# Patient Record
Sex: Male | Born: 1995 | Race: Black or African American | Hispanic: No | Marital: Single | Smoking: Current every day smoker
Health system: Southern US, Community
[De-identification: ages and names within clinical notes are randomized; demographics above are authoritative.]

---
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
## 2017-10-19 ENCOUNTER — Encounter (HOSPITAL_COMMUNITY): Payer: Self-pay

## 2017-10-19 ENCOUNTER — Emergency Department (HOSPITAL_COMMUNITY)
Admission: EM | Admit: 2017-10-19 | Discharge: 2017-10-19 | Disposition: A | Discharge status: Home / Self Care | Payer: BLUE CROSS/BLUE SHIELD | Attending: Emergency Medicine | Admitting: Emergency Medicine

## 2017-10-19 ENCOUNTER — Other Ambulatory Visit: Payer: Self-pay

## 2017-10-19 DIAGNOSIS — J40 Bronchitis, not specified as acute or chronic: Secondary | ICD-10-CM | POA: Diagnosis not present

## 2017-10-19 DIAGNOSIS — F17210 Nicotine dependence, cigarettes, uncomplicated: Secondary | ICD-10-CM | POA: Diagnosis not present

## 2017-10-19 DIAGNOSIS — R0981 Nasal congestion: Secondary | ICD-10-CM | POA: Insufficient documentation

## 2017-10-19 DIAGNOSIS — R05 Cough: Secondary | ICD-10-CM | POA: Diagnosis not present

## 2017-10-19 DIAGNOSIS — J029 Acute pharyngitis, unspecified: Secondary | ICD-10-CM

## 2017-10-19 DIAGNOSIS — R509 Fever, unspecified: Secondary | ICD-10-CM | POA: Diagnosis not present

## 2017-10-19 LAB — RAPID STREP SCREEN (MED CTR MEBANE ONLY): STREPTOCOCCUS, GROUP A SCREEN (DIRECT): NEGATIVE

## 2017-10-19 MED ORDER — BENZONATATE 100 MG PO CAPS: 100.0000 mg | ORAL_CAPSULE | Freq: Three times a day (TID) | ORAL | 0 refills | Status: DC

## 2017-10-19 MED ORDER — AZITHROMYCIN 250 MG PO TABS: ORAL_TABLET | ORAL | 0 refills | Status: DC

## 2017-10-19 NOTE — ED Triage Notes (Signed)
Patient presents with sore throat and chills starting Monday.

## 2017-10-19 NOTE — ED Provider Notes (Signed)
[facility]-EMERGENCY DEPT Provider Note   CSN: [id number] Arrival date & time: 10/19/17  1832     History   Chief Complaint Chief Complaint  Patient presents with  . Sore Throat    HPI Riley Carpenter is a 22 y.o. male who presents to the ED with sore throat chills and fever that started a few days ago. Patient is an every day smoker and reports productive cough with green sputum. Patient has been around people that have been sick.   HPI  History reviewed. No pertinent past medical history.  There are no active problems to display for this patient.   History reviewed. No pertinent surgical history.      Home Medications    Prior to Admission medications   Medication Sig Start Date End Date Taking? Authorizing Provider  azithromycin (ZITHROMAX Z-PAK) 250 MG tablet Take the first 2 tablets now and then one tablet PO daily 10/19/17   Janne NapoleonNeese, Alnita Aybar M, NP  benzonatate (TESSALON) 100 MG capsule Take 1 capsule (100 mg total) by mouth every 8 (eight) hours. 10/19/17   Janne NapoleonNeese, Kaseem Vastine M, NP  ibuprofen (ADVIL,MOTRIN) 600 MG tablet Take 1 tablet (600 mg total) by mouth every 6 (six) hours as needed. 12/19/14   Elson AreasSofia, Leslie K, PA-C    Family History History reviewed. No pertinent family history.  Social History Social History   Tobacco Use  . Smoking status: Never Smoker  Substance Use Topics  . Alcohol use: No  . Drug use: Not on file     Allergies   Patient has no known allergies.   Review of Systems Review of Systems  Constitutional: Positive for chills and fever.  HENT: Positive for congestion and sore throat.   Eyes: Negative for pain and discharge.  Respiratory: Positive for cough. Negative for shortness of breath.   Cardiovascular: Negative for chest pain.  Gastrointestinal: Negative for abdominal pain, diarrhea, nausea and vomiting.  Genitourinary: Negative for frequency.  Musculoskeletal: Positive for myalgias.  Skin: Negative for rash.    Neurological: Negative for headaches.  Psychiatric/Behavioral: Negative for confusion.     Physical Exam Updated Vital Signs BP (!) 145/75 (BP Location: Left Arm)   Pulse 92   Temp 99 F (37.2 C) (Oral)   Resp 18   Ht 6\' 3"  (1.905 m)   Wt 117.9 kg (260 lb)   SpO2 97%   BMI 32.50 kg/m   Physical Exam  Constitutional: He appears well-developed and well-nourished. He does not appear ill. He appears distressed.  HENT:  Head: Normocephalic and atraumatic.  Right Ear: Tympanic membrane normal.  Left Ear: Tympanic membrane normal.  Nose: Mucosal edema and rhinorrhea present.  Mouth/Throat: Uvula is midline and mucous membranes are normal. Posterior oropharyngeal erythema present. No posterior oropharyngeal edema or tonsillar abscesses.  Eyes: Pupils are equal, round, and reactive to light. Conjunctivae and EOM are normal.  Neck: Neck supple.  Cardiovascular: Normal rate and regular rhythm.  Pulmonary/Chest: Effort normal. He has no wheezes. He has no rales.  Abdominal: Soft. There is no tenderness.  Musculoskeletal: Normal range of motion.  Neurological: He is alert.  Skin: Skin is warm and dry.  Psychiatric: He has a normal mood and affect. His behavior is normal.  Nursing note and vitals reviewed.    ED Treatments / Results  Labs (all labs ordered are listed, but only abnormal results are displayed) Labs Reviewed  RAPID STREP SCREEN (NOT AT [facility])  CULTURE, GROUP A STREP [facility])  EKG None  Radiology No results found.  Procedures Procedures (including critical care time)  Medications Ordered in ED Medications - No data to display   Initial Impression / Assessment and Plan / ED Course  I have reviewed the triage vital signs and the nursing notes. 22 y.o. male that is an every day smoker stable for d/c with negative strep screen and does not appear toxic. Will treat for bronchitis. Patient to f/u with PCP or return for worsening symptoms.   Final Clinical  Impressions(s) / ED Diagnoses   Final diagnoses:  Bronchitis  Sore throat    ED Discharge Orders        Ordered    azithromycin (ZITHROMAX Z-PAK) 250 MG tablet     10/19/17 2029    benzonatate (TESSALON) 100 MG capsule  Every 8 hours     10/19/17 2029       Kerrie Buffalo [place], NP 10/19/17 2030    Tegeler, Canary Brim, MD 10/19/17 [phone]

## 2017-10-19 NOTE — Discharge Instructions (Addendum)
Take tylenol and ibuprofen as needed for fever or body aches. Follow up with your doctor. Return here for worsening symptoms.

## 2017-10-23 LAB — CULTURE, GROUP A STREP (THRC)

## 2017-11-12 ENCOUNTER — Ambulatory Visit (INDEPENDENT_AMBULATORY_CARE_PROVIDER_SITE_OTHER): Payer: Self-pay | Admitting: Physician Assistant

## 2017-11-12 DIAGNOSIS — Z0283 Encounter for blood-alcohol and blood-drug test: Secondary | ICD-10-CM

## 2017-11-14 ENCOUNTER — Telehealth: Payer: Self-pay | Admitting: Physician Assistant

## 2017-11-14 NOTE — Patient Instructions (Signed)
Pt here for self pay drug screen only.

## 2017-11-14 NOTE — Telephone Encounter (Signed)
Copied from CRM [id number]. Topic: General - Other >> Nov 14, 2017  1:37 PM Gerrianne ScalePayne, Ferguson Gertner L wrote: Reason for CRM: patient calling to see if his results are in from a drug screen that he took an Monday

## 2017-11-14 NOTE — Progress Notes (Signed)
Pt here for self pay drug screen

## 2017-11-15 ENCOUNTER — Telehealth: Payer: Self-pay | Admitting: Physician Assistant

## 2017-11-15 NOTE — Telephone Encounter (Signed)
Patient is calling back to check on the status of this. Please advise  

## 2017-11-15 NOTE — Telephone Encounter (Signed)
Pt spoke to JennetteLizzie this morning about DS results, but he came by because he says he has been told several different things about how long it will take to come back.  Is there a way to also speed up the process?  Please call him again [phone]

## 2017-11-15 NOTE — Telephone Encounter (Signed)
Phone call to patient. Advised that it may take up to two weeks for results, please call if it has been more than two weeks and he has not been contacted with results. He is agreeable.

## 2017-11-19 NOTE — Progress Notes (Signed)
I did not evaluate this pt. FT only.

## 2017-12-02 ENCOUNTER — Encounter (HOSPITAL_COMMUNITY): Payer: Self-pay

## 2017-12-02 ENCOUNTER — Emergency Department (HOSPITAL_COMMUNITY)
Admission: EM | Admit: 2017-12-02 | Discharge: 2017-12-02 | Disposition: A | Discharge status: Home / Self Care | Payer: BLUE CROSS/BLUE SHIELD | Attending: Emergency Medicine | Admitting: Emergency Medicine

## 2017-12-02 ENCOUNTER — Other Ambulatory Visit: Payer: Self-pay

## 2017-12-02 DIAGNOSIS — F17200 Nicotine dependence, unspecified, uncomplicated: Secondary | ICD-10-CM | POA: Insufficient documentation

## 2017-12-02 DIAGNOSIS — J0100 Acute maxillary sinusitis, unspecified: Secondary | ICD-10-CM

## 2017-12-02 DIAGNOSIS — J40 Bronchitis, not specified as acute or chronic: Secondary | ICD-10-CM

## 2017-12-02 DIAGNOSIS — J029 Acute pharyngitis, unspecified: Secondary | ICD-10-CM | POA: Diagnosis present

## 2017-12-02 LAB — GROUP A STREP BY PCR: Group A Strep by PCR: NOT DETECTED

## 2017-12-02 MED ORDER — PREDNISONE 10 MG (21) PO TBPK: ORAL_TABLET | ORAL | 0 refills | Status: DC

## 2017-12-02 MED ORDER — BENZONATATE 200 MG PO CAPS: 200.0000 mg | ORAL_CAPSULE | Freq: Three times a day (TID) | ORAL | 0 refills | Status: DC | PRN

## 2017-12-02 MED ORDER — IPRATROPIUM-ALBUTEROL 0.5-2.5 (3) MG/3ML IN SOLN
3.0000 mL | Freq: Once | RESPIRATORY_TRACT | Status: AC
  Administered 2017-12-02: 3 mL via RESPIRATORY_TRACT
  Filled 2017-12-02: qty 3

## 2017-12-02 MED ORDER — AMOXICILLIN-POT CLAVULANATE 875-125 MG PO TABS: 1.0000 | ORAL_TABLET | Freq: Two times a day (BID) | ORAL | 0 refills | Status: DC

## 2017-12-02 NOTE — ED Triage Notes (Signed)
Pt c/o nasal congestion,sore throat, cough, yellow sputum x3 days. Tried allergy medicine, little relief.

## 2017-12-02 NOTE — ED Provider Notes (Signed)
Riley Carpenter Provider Note   CSN: [id number] Arrival date & time: 12/02/17  1307     History   Chief Complaint Chief Complaint  Patient presents with  . Sore Throat  . Nasal Congestion    HPI Riley Carpenter is a 22 y.o. male, every day smoker, who presents to the ED with sore throat, ear pain and fever that started 3 days ago. Patient reports he has post nasal drainage and pain around his eyes and forehead. His nasal drainage is yellow/green. He does have a cough but thinks partly due to the post nasal drainage.   The history is provided by the patient. No language interpreter was used.  URI   This is a new problem. The current episode started more than 2 days ago. The maximum temperature recorded prior to his arrival was 101 to 101.9 F. Associated symptoms include congestion, plugged ear sensation, sinus pain, sore throat, swollen glands, cough and wheezing. Pertinent negatives include no abdominal pain, no diarrhea, no nausea, no vomiting, no dysuria, no ear pain, no headaches and no rash.    History reviewed. No pertinent past medical history.  There are no active problems to display for this patient.   History reviewed. No pertinent surgical history.      Home Medications    Prior to Admission medications   Medication Sig Start Date End Date Taking? Authorizing Provider  amoxicillin-clavulanate (AUGMENTIN) 875-125 MG tablet Take 1 tablet by mouth every 12 (twelve) hours. 12/02/17   Riley Napoleon, NP  benzonatate (TESSALON) 200 MG capsule Take 1 capsule (200 mg total) by mouth 3 (three) times daily as needed for cough. 12/02/17   Riley Napoleon, NP  predniSONE (STERAPRED UNI-PAK 21 TAB) 10 MG (21) TBPK tablet Take 6 tablets today PO then 5, 4, 3, 2, 1 12/02/17   Riley Napoleon, NP    Family History History reviewed. No pertinent family history.  Social History Social History   Tobacco Use  . Smoking status: Current Every Day Smoker   Packs/day: 1.00  Substance Use Topics  . Alcohol use: Yes    Comment: occasionally  . Drug use: Not on file     Allergies   Patient has no known allergies.   Review of Systems Review of Systems  Constitutional: Positive for chills and fever.  HENT: Positive for congestion, sinus pain and sore throat. Negative for ear pain.   Eyes: Negative for pain, discharge, redness and itching.  Respiratory: Positive for cough and wheezing.   Gastrointestinal: Negative for abdominal pain, diarrhea, nausea and vomiting.  Genitourinary: Negative for dysuria, frequency and urgency.  Musculoskeletal: Positive for myalgias.  Skin: Negative for rash.  Neurological: Negative for syncope and headaches.  Psychiatric/Behavioral: Negative for confusion.     Physical Exam Updated Vital Signs BP 104/69 (BP Location: Right Arm)   Pulse 99   Temp 99.8 F (37.7 C) (Oral)   Resp 16   Ht  (1.905 m)   Wt 117.9 kg (260 lb)   SpO2 98%   BMI 32.50 kg/m   Physical Exam  Constitutional: He appears well-developed and well-nourished. No distress.  HENT:  Head: Normocephalic.  Right Ear: Tympanic membrane normal.  Left Ear: Tympanic membrane is erythematous.  Nose: Mucosal edema and rhinorrhea present. Right sinus exhibits maxillary sinus tenderness. Left sinus exhibits maxillary sinus tenderness.  Mouth/Throat: Uvula is midline and mucous membranes are normal. Posterior oropharyngeal erythema present.  Eyes: Pupils are equal, round, and  reactive to light. Conjunctivae and EOM are normal.  Neck: Normal range of motion. Neck supple.  Cardiovascular: Normal rate and regular rhythm.  Pulmonary/Chest: Effort normal. He has wheezes (occasional).  Abdominal: Soft. There is no tenderness.  Musculoskeletal: Normal range of motion.  Lymphadenopathy:    He has cervical adenopathy.  Neurological: He is alert.  Skin: Skin is warm and dry.  Psychiatric: He has a normal mood and affect. His behavior is  normal.  Nursing note and vitals reviewed.    ED Treatments / Results  Labs (all labs ordered are listed, but only abnormal results are displayed) Labs Reviewed  GROUP A STREP BY PCR   Radiology No results found.  Procedures Procedures (including critical care time)  Medications Ordered in ED Medications  ipratropium-albuterol (DUONEB) 0.5-2.5 (3) MG/3ML nebulizer solution 3 mL (3 mLs Nebulization Given 12/02/17 1420)     Initial Impression / Assessment and Plan / ED Course  I have reviewed the triage vital signs and the nursing notes. 22 y.o. male every day smoker here with fever, sore throat, left ear pain, sinus pain and drainage stable for d/c without meningeal signs and does not appear toxic. Will treat for sinusitis and encouraged patient to f/u with a PCP. Return precautions discussed.   Final Clinical Impressions(s) / ED Diagnoses   Final diagnoses:  Acute maxillary sinusitis, recurrence not specified  Bronchitis    ED Discharge Orders        Ordered    amoxicillin-clavulanate (AUGMENTIN) 875-125 MG tablet  Every 12 hours     12/02/17 1740    benzonatate (TESSALON) 200 MG capsule  3 times daily PRN     12/02/17 1740    predniSONE (STERAPRED UNI-PAK 21 TAB) 10 MG (21) TBPK tablet     12/02/17 1740       Damian Leavell [place], NP 12/02/17 1942    Shaune Pollack, MD 12/03/17 [phone]

## 2017-12-02 NOTE — ED Notes (Signed)
Called lab to inquire about delay on strep test--they are running strep test now.

## 2018-08-14 ENCOUNTER — Encounter: Payer: Self-pay | Admitting: Family Medicine

## 2018-08-14 ENCOUNTER — Ambulatory Visit (INDEPENDENT_AMBULATORY_CARE_PROVIDER_SITE_OTHER): Payer: Self-pay | Admitting: Family Medicine

## 2018-08-14 VITALS — BP 142/65 | HR 65 | Temp 98.3°F | Resp 16 | Ht 75.0 in | Wt 285.0 lb

## 2018-08-14 DIAGNOSIS — R1011 Right upper quadrant pain: Secondary | ICD-10-CM

## 2018-08-14 DIAGNOSIS — Z833 Family history of diabetes mellitus: Secondary | ICD-10-CM

## 2018-08-14 DIAGNOSIS — E669 Obesity, unspecified: Secondary | ICD-10-CM

## 2018-08-14 DIAGNOSIS — Z114 Encounter for screening for human immunodeficiency virus [HIV]: Secondary | ICD-10-CM

## 2018-08-14 NOTE — Progress Notes (Signed)
[facility]  New Patient Encounter Provider: Mike Gip, [place]    PIR:[id number]  YTK:[id number]  DOB - July 14, 1996  SUBJECTIVE:   Riley Carpenter, is a 23 y.o. male who presents to establish care with this clinic.   Current problems/concerns:  Patient states that he has RUQ pain with a "knot" that he noticed yesterday. He states that he has a significant family history for diabetes and hypertension.  Patient states that he does not drink a lot of water. Main source of fluid is soda. Patient states that he cooks more at home and is not eating as much fast food. Has been unemployed for the past 6 months and has decreased physical activity. Patient states that he lost weight and went  from 350 to 250. Has slowly started to increase weight in since being unemployed.  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    No Known Allergies History reviewed. No pertinent past medical history. No current outpatient medications on file prior to visit.   No current facility-administered medications on file prior to visit.    Family History  Problem Relation Age of Onset  . Diabetes Mother   . Diabetes Father    Social History   Socioeconomic History  . Marital status: Single    Spouse name: Not on file  . Number of children: Not on file  . Years of education: Not on file  . Highest education level: Not on file  Occupational History  . Not on file  Social Needs  . Financial resource strain: Not on file  . Food insecurity:    Worry: Not on file    Inability: Not on file  . Transportation needs:    Medical: Not on file    Non-medical: Not on file  Tobacco Use  . Smoking status: Current Every Day Smoker    Packs/day: 1.00  . Smokeless tobacco: Current User    Types: Chew  Substance and Sexual Activity  . Alcohol use: Yes    Comment: occasionally  . Drug use: Not Currently  . Sexual activity: Not on  file  Lifestyle  . Physical activity:    Days per week: Not on file    Minutes per session: Not on file  . Stress: Not on file  Relationships  . Social connections:    Talks on phone: Not on file    Gets together: Not on file    Attends religious service: Not on file    Active member of club or organization: Not on file    Attends meetings of clubs or organizations: Not on file    Relationship status: Not on file  . Intimate partner violence:    Fear of current or ex partner: Not on file    Emotionally abused: Not on file    Physically abused: Not on file    Forced sexual activity: Not on file  Other Topics Concern  . Not on file  Social History Narrative  . Not on file    Review of Systems  Constitutional: Negative.   HENT: Negative.   Eyes: Negative.   Respiratory: Negative.   Cardiovascular: Negative.   Gastrointestinal: Positive for abdominal pain.  Genitourinary: Negative.   Musculoskeletal: Negative.   Skin: Negative.   Neurological: Negative.   Psychiatric/Behavioral: Negative.      OBJECTIVE:    BP (!) 142/65 (BP Location: Right Arm, Patient Position: Sitting, Cuff Size: Large)  Pulse 65   Temp 98.3 F (36.8 C) (Oral)   Resp 16   Ht 6\' 3"  (1.905 m)   Wt 285 lb (129.3 kg)   SpO2 99%   BMI 35.62 kg/m   Physical Exam  Constitutional: He is oriented to person, place, and time and well-developed, well-nourished, and in no distress. No distress.  HENT:  Head: Normocephalic and atraumatic.  Eyes: Pupils are equal, round, and reactive to light. Conjunctivae and EOM are normal.  Neck: Normal range of motion. Neck supple.  Cardiovascular: Normal rate, regular rhythm and intact distal pulses. Exam reveals no gallop and no friction rub.  No murmur heard. Pulmonary/Chest: Effort normal and breath sounds normal. No respiratory distress. He has no wheezes.  Abdominal: Soft. Bowel sounds are normal. There is no abdominal tenderness.  Musculoskeletal: Normal  range of motion.        General: No tenderness or edema.  Lymphadenopathy:    He has no cervical adenopathy.  Neurological: He is alert and oriented to person, place, and time. Gait normal.  Skin: Skin is warm and dry.  Psychiatric: Mood, memory, affect and judgment normal.  Nursing note and vitals reviewed.    ASSESSMENT/PLAN:  1. RUQ pain No pain in the exam today. Labs ordered. Will continue to monitor.  - CBC with Differential - Amylase - Lipase  2. Family history of diabetes mellitus (DM) The patient is asked to make an attempt to improve diet and exercise patterns to aid in medical management of this problem. Pending labs. Will adjust medications accordingly.    - Hemoglobin A1c - CBC with Differential  3. Obesity (BMI 35.0-39.9 without comorbidity) Patient encouraged to be more active and to decrease soda intake.  - Hemoglobin A1c - Comprehensive metabolic panel - TSH  4. Screening for HIV (human immunodeficiency virus) - HIV antibody (with reflex)  Return in about 1 year (around 08/15/2019), or if symptoms worsen or fail to improve.  The patient was given clear instructions to go to ER or return to medical center if symptoms don't improve, worsen or new problems develop. The patient verbalized understanding. The patient was told to call to get lab results if they haven't heard anything in the next week.     This note has been created with Education officer, environmental. Any transcriptional errors are unintentional.   Ms. Andr L. Riley Lam, FNP-BC [facility] [address] [place], [place] [address] [phone]

## 2018-08-14 NOTE — Patient Instructions (Signed)
Healthy Eating to Prevent Digestive Disorders The digestive system starts at the mouth and goes all the way down to the rectum. Along the way, your digestive system breaks down the food you eat so you can absorb its nutrients and use them for energy. Digestive disorders can cause gas, bloating, pain, heartburn, and other symptoms. They can prevent your digestive system from doing its job. Healthy eating and a healthy lifestyle can help you avoid many common digestive disorders. What nutrition changes can be made? Start by eating a balanced diet. Eat healthy foods from all the major food groups. These include carbohydrates, fats, and proteins. Other changes you can make include to:  Eat enough fiber. Fiber is a healthy carbohydrate that cleans out your digestive system. Fiber absorbs water and helps you have regular bowel movements. Fiber comes from plants. To get enough fiber in your diet, eat 4-5 servings of fruits, vegetables, and legumes every day. Include beans and whole grains. Most people should get 20?35 grams of fiber each day.  Drink enough water to keep your urine clear or pale yellow. Water helps your body digest food. It can also help prevent constipation.  Avoid fatty proteins. Full-fat dairy products and fatty meats are hard to digest. Fats you want to avoid are those that get solid at room temperature (saturated fats). Instead of eating these kinds of fats, eat plant-based unsaturated fats found in olives, canola, corn, avocado, and nuts.  If you have trouble with gas, belching, or flatulence, avoid gas-producing foods. These include beans, carbonated beverages, cabbage, cauliflower, and broccoli. If you are lactose intolerant, avoid dairy products or choose lactose-free dairy products.  If you have frequent heartburn, stay away from alcohol, caffeine, fatty foods, chocolate, and peppermint. Avoid lying down within two hours of eating a full meal. Overeating and lying down too soon after  a meal can cause heartburn.  Add probiotics to your diet. Healthy digestion depends on having the right balance of good bacteria in your colon. Probiotics can help restore the balance of good bacteria in your digestive system. Probiotics are live active cultures that are found in yogurt, kefir, and cultured foods like sauerkraut and miso. You can also add good bacteria with probiotic supplements.  Make sure to chew your food slowly and completely.  Instead of eating three large meals each day, eat three small meals with three small snacks.  What other changes can I make? You can help your digestive system stay healthy by making these lifestyle changes:  Stay active and exercise every day.  Maintain a healthy weight.  Eat on a regular schedule.  Avoid tight-fitting clothes. They can restrict digestion.  If you have frequent heartburn, raise the head of your bed 2-3 inches (5-7.5 cm).  Do not use any tobacco products, such as cigarettes, chewing tobacco, and e-cigarettes. If you need help quitting, ask your health care provider.  Limit alcohol intake to no more than 1 drink a day for nonpregnant women and 2 drinks a day for men. One drink equals 12 oz of beer, 5 oz of wine, or 1 oz of hard liquor.  Avoid stress. Find ways to reduce stress, such as meditation, exercise, or taking time for activities that relax you. Why should I make these changes? Making these changes will help your digestive system function at its best. A healthy digestive system can help you avoid or improve your management of digestive disorders such as:  Bloating, gas, and flatulence.  Heartburn.  Gastroesophageal reflux disease (  GERD).  Peptic ulcer disease.  Hemorrhoids.  Diverticulitis.  Constipation.  Diarrhea.  Gall stones  Irritable bowel syndrome.  Malnutrition.  Fatty liver disease. What can happen if changes are not made? Not making these changes could put you at risk for many  conditions caused by a poor diet or an unhealthy weight, such as heart disease, stroke and diabetes. Where can I get more information? Learn more about healthy eating and digestive disorders by visiting these websites:  Academy of Nutrition and Dietetics: SplashPops.ca  Centers for Disease Control and Prevention: TanClothes.com.cy  U.S. Department of Health and Human Services: CosmeticsCritic.si.pdf Summary  A heathy diet can help prevent many digestive disorders.  Eat a balanced diet consisting of fiber, unsaturated fats, lean protein, fruits, and vegetables.  Eat three small meals with three small snacks per day.  Drink plenty of water every day.  Get plenty of exercise and maintain a healthy weight. This information is not intended to replace advice given to you by your health care provider. Make sure you discuss any questions you have with your health care provider. Document Released: 08/13/2015 Document Revised: 12/23/2015 Document Reviewed: 03/29/2016 Elsevier Interactive Patient Education  2019 [employer]. Health Maintenance, Male A healthy lifestyle and preventive care is important for your health and wellness. Ask your health care provider about what schedule of regular examinations is right for you. What should I know about weight and diet? Eat a Healthy Diet  Eat plenty of vegetables, fruits, whole grains, low-fat dairy products, and lean protein.  Do not eat a lot of foods high in solid fats, added sugars, or salt.  Maintain a Healthy Weight Regular exercise can help you achieve or maintain a healthy weight. You should:  Do at least 150 minutes of exercise each week. The exercise should increase your heart rate and make you sweat (moderate-intensity exercise).  Do strength-training exercises at least twice a week. Watch Your  Levels of Cholesterol and Blood Lipids  Have your blood tested for lipids and cholesterol every 5 years starting at 22 years of age. If you are at high risk for heart disease, you should start having your blood tested when you are 22 years old. You may need to have your cholesterol levels checked more often if: ? Your lipid or cholesterol levels are high. ? You are older than 22 years of age. ? You are at high risk for heart disease. What should I know about cancer screening? Many types of cancers can be detected early and may often be prevented. Lung Cancer  You should be screened every year for lung cancer if: ? You are a current smoker who has smoked for at least 30 years. ? You are a former smoker who has quit within the past 15 years.  Talk to your health care provider about your screening options, when you should start screening, and how often you should be screened. Colorectal Cancer  Routine colorectal cancer screening usually begins at 22 years of age and should be repeated every 5-10 years until you are 22 years old. You may need to be screened more often if early forms of precancerous polyps or small growths are found. Your health care provider may recommend screening at an earlier age if you have risk factors for colon cancer.  Your health care provider may recommend using home test kits to check for hidden blood in the stool.  A small camera at the end of a tube can be used to examine your colon (  sigmoidoscopy or colonoscopy). This checks for the earliest forms of colorectal cancer. Prostate and Testicular Cancer  Depending on your age and overall health, your health care provider may do certain tests to screen for prostate and testicular cancer.  Talk to your health care provider about any symptoms or concerns you have about testicular or prostate cancer. Skin Cancer  Check your skin from head to toe regularly.  Tell your health care provider about any new moles or  changes in moles, especially if: ? There is a change in a mole's size, shape, or color. ? You have a mole that is larger than a pencil eraser.  Always use sunscreen. Apply sunscreen liberally and repeat throughout the day.  Protect yourself by wearing long sleeves, pants, a wide-brimmed hat, and sunglasses when outside. What should I know about heart disease, diabetes, and high blood pressure?  If you are 6818-739 years of age, have your blood pressure checked every 3-5 years. If you are 22 years of age or older, have your blood pressure checked every year. You should have your blood pressure measured twice-once when you are at a hospital or clinic, and once when you are not at a hospital or clinic. Record the average of the two measurements. To check your blood pressure when you are not at a hospital or clinic, you can use: ? An automated blood pressure machine at a pharmacy. ? A home blood pressure monitor.  Talk to your health care provider about your target blood pressure.  If you are between 5445-22 years old, ask your health care provider if you should take aspirin to prevent heart disease.  Have regular diabetes screenings by checking your fasting blood sugar level. ? If you are at a normal weight and have a low risk for diabetes, have this test once every three years after the age of 22. ? If you are overweight and have a high risk for diabetes, consider being tested at a younger age or more often.  A one-time screening for abdominal aortic aneurysm (AAA) by ultrasound is recommended for men aged 65-75 years who are current or former smokers. What should I know about preventing infection? Hepatitis B If you have a higher risk for hepatitis B, you should be screened for this virus. Talk with your health care provider to find out if you are at risk for hepatitis B infection. Hepatitis C Blood testing is recommended for:  Everyone born from [birth year] through 1964.  Anyone with known risk  factors for hepatitis C. Sexually Transmitted Diseases (STDs)  You should be screened each year for STDs including gonorrhea and chlamydia if: ? You are sexually active and are younger than 22 years of age. ? You are older than 22 years of age and your health care provider tells you that you are at risk for this type of infection. ? Your sexual activity has changed since you were last screened and you are at an increased risk for chlamydia or gonorrhea. Ask your health care provider if you are at risk.  Talk with your health care provider about whether you are at high risk of being infected with HIV. Your health care provider may recommend a prescription medicine to help prevent HIV infection. What else can I do?  Schedule regular health, dental, and eye exams.  Stay current with your vaccines (immunizations).  Do not use any tobacco products, such as cigarettes, chewing tobacco, and e-cigarettes. If you need help quitting, ask your health care provider.  Limit alcohol intake to no more than 2 drinks per day. One drink equals 12 ounces of beer, 5 ounces of wine, or 1 ounces of hard liquor.  Do not use street drugs.  Do not share needles.  Ask your health care provider for help if you need support or information about quitting drugs.  Tell your health care provider if you often feel depressed.  Tell your health care provider if you have ever been abused or do not feel safe at home. This information is not intended to replace advice given to you by your health care provider. Make sure you discuss any questions you have with your health care provider. Document Released: 01/13/2008 Document Revised: 03/15/2016 Document Reviewed: 04/20/2015 Elsevier Interactive Patient Education  2019 Elsevier Inc. Abdominal Pain, Adult  Many things can cause belly (abdominal) pain. Most times, belly pain is not dangerous. Many cases of belly pain can be watched and treated at home. Sometimes belly pain  is serious, though. Your doctor will try to find the cause of your belly pain. Follow these instructions at home:  Take over-the-counter and prescription medicines only as told by your doctor. Do not take medicines that help you poop (laxatives) unless told to by your doctor.  Drink enough fluid to keep your pee (urine) clear or pale yellow.  Watch your belly pain for any changes.  Keep all follow-up visits as told by your doctor. This is important. Contact a doctor if:  Your belly pain changes or gets worse.  You are not hungry, or you lose weight without trying.  You are having trouble pooping (constipated) or have watery poop (diarrhea) for more than 2-3 days.  You have pain when you pee or poop.  Your belly pain wakes you up at night.  Your pain gets worse with meals, after eating, or with certain foods.  You are throwing up and cannot keep anything down.  You have a fever. Get help right away if:  Your pain does not go away as soon as your doctor says it should.  You cannot stop throwing up.  Your pain is only in areas of your belly, such as the right side or the left lower part of the belly.  You have bloody or black poop, or poop that looks like tar.  You have very bad pain, cramping, or bloating in your belly.  You have signs of not having enough fluid or water in your body (dehydration), such as: ? Dark pee, very little pee, or no pee. ? Cracked lips. ? Dry mouth. ? Sunken eyes. ? Sleepiness. ? Weakness. This information is not intended to replace advice given to you by your health care provider. Make sure you discuss any questions you have with your health care provider. Document Released: 01/03/2008 Document Revised: 02/04/2016 Document Reviewed: 12/29/2015 Elsevier Interactive Patient Education  2019 [employer].

## 2018-08-15 LAB — COMPREHENSIVE METABOLIC PANEL
ALT: 33 IU/L (ref 0–44)
AST: 29 IU/L (ref 0–40)
Albumin/Globulin Ratio: 2.2 (ref 1.2–2.2)
Albumin: 4.6 g/dL (ref 3.5–5.5)
Alkaline Phosphatase: 74 IU/L (ref 39–117)
BUN/Creatinine Ratio: 15 (ref 9–20)
BUN: 14 mg/dL (ref 6–20)
Bilirubin Total: 1.2 mg/dL (ref 0.0–1.2)
CO2: 22 mmol/L (ref 20–29)
Calcium: 9.3 mg/dL (ref 8.7–10.2)
Chloride: 102 mmol/L (ref 96–106)
Creatinine, Ser: 0.93 mg/dL (ref 0.76–1.27)
GFR calc Af Amer: 135 mL/min/{1.73_m2} (ref >59)
GFR calc non Af Amer: 117 mL/min/{1.73_m2} (ref >59)
Globulin, Total: 2.1 g/dL (ref 1.5–4.5)
Glucose: 80 mg/dL (ref 65–99)
Potassium: 4 mmol/L (ref 3.5–5.2)
Sodium: 141 mmol/L (ref 134–144)
Total Protein: 6.7 g/dL (ref 6.0–8.5)

## 2018-08-15 LAB — CBC WITH DIFFERENTIAL/PLATELET
Basophils Absolute: 0.1 10*3/uL (ref 0.0–0.2)
Basos: 1 %
EOS (ABSOLUTE): 0.2 10*3/uL (ref 0.0–0.4)
Eos: 2 %
Hematocrit: 50.8 % (ref 37.5–51.0)
Hemoglobin: 17 g/dL (ref 13.0–17.7)
Immature Grans (Abs): 0.1 10*3/uL (ref 0.0–0.1)
Immature Granulocytes: 1 %
Lymphocytes Absolute: 3 10*3/uL (ref 0.7–3.1)
Lymphs: 29 %
MCH: 29.7 pg (ref 26.6–33.0)
MCHC: 33.5 g/dL (ref 31.5–35.7)
MCV: 89 fL (ref 79–97)
Monocytes Absolute: 1 10*3/uL — ABNORMAL HIGH (ref 0.1–0.9)
Monocytes: 9 %
Neutrophils Absolute: 6.1 10*3/uL (ref 1.4–7.0)
Neutrophils: 58 %
Platelets: 150 10*3/uL (ref 150–450)
RBC: 5.72 x10E6/uL (ref 4.14–5.80)
RDW: 13 % (ref 11.6–15.4)
WBC: 10.5 10*3/uL (ref 3.4–10.8)

## 2018-08-15 LAB — LIPASE: Lipase: 41 U/L (ref 13–78)

## 2018-08-15 LAB — HEMOGLOBIN A1C
Est. average glucose Bld gHb Est-mCnc: 105 mg/dL
Hgb A1c MFr Bld: 5.3 % (ref 4.8–5.6)

## 2018-08-15 LAB — TSH: TSH: 1.05 u[IU]/mL (ref 0.450–4.500)

## 2018-08-15 LAB — AMYLASE: Amylase: 41 U/L (ref 31–124)

## 2018-08-15 LAB — HIV ANTIBODY (ROUTINE TESTING W REFLEX): HIV Screen 4th Generation wRfx: NONREACTIVE

## 2018-08-16 ENCOUNTER — Telehealth: Payer: Self-pay

## 2018-08-16 NOTE — Telephone Encounter (Signed)
-----   Message from Mike Gip, FNP sent at 08/15/2018  5:55 PM EST ----- The  labs are normal or within acceptable limits. No Medication changes. He is not diabetic or pre-diabetic.

## 2018-08-16 NOTE — Telephone Encounter (Signed)
Called, no answer. Left a message that all labs were normal and to call back if any questions. Thanks!

## 2018-09-18 ENCOUNTER — Encounter: Payer: Self-pay | Admitting: Family Medicine

## 2018-09-18 ENCOUNTER — Ambulatory Visit (INDEPENDENT_AMBULATORY_CARE_PROVIDER_SITE_OTHER): Payer: Self-pay | Admitting: Family Medicine

## 2018-09-18 VITALS — BP 137/61 | HR 73 | Temp 98.9°F | Resp 16 | Ht 75.0 in | Wt 285.0 lb

## 2018-09-18 DIAGNOSIS — Z8619 Personal history of other infectious and parasitic diseases: Secondary | ICD-10-CM

## 2018-09-18 NOTE — Progress Notes (Signed)
  [facility]   Progress Note: Sick Visit Provider: Mike Gip, FNP  SUBJECTIVE:   Riley Carpenter is a 23 y.o. male who  has no past medical history on file.. Patient presents today for Abdominal Pain (x 1 week. Nausea and vomiting last week. )  Patient states that he has had nausea, vomitting and diarrhea x 5 days. Patient states that the symptoms have subsided. He states that his last episode was 6 days ago. He is able to eat and drink without problems. He denies fever, chills or night sweats.  Review of Systems  Constitutional: Negative.   HENT: Negative.   Eyes: Negative.   Respiratory: Negative.   Cardiovascular: Negative.   Gastrointestinal: Positive for abdominal pain, diarrhea, nausea and vomiting. Negative for blood in stool.  Genitourinary: Negative.   Musculoskeletal: Negative.   Skin: Negative.   Neurological: Negative.   Psychiatric/Behavioral: Negative.      OBJECTIVE: BP 137/61 (BP Location: Left Arm, Patient Position: Sitting, Cuff Size: Large)   Pulse 73   Temp 98.9 F (37.2 C) (Oral)   Resp 16   Ht 6\' 3"  (1.905 m)   Wt 285 lb (129.3 kg)   SpO2 98%   BMI 35.62 kg/m   Wt Readings from Last 3 Encounters:  09/18/18 285 lb (129.3 kg)  08/14/18 285 lb (129.3 kg)  12/02/17 260 lb (117.9 kg)     Physical Exam Vitals signs and nursing note reviewed.  Constitutional:      General: He is not in acute distress.    Appearance: He is well-developed.  HENT:     Head: Normocephalic and atraumatic.  Eyes:     Conjunctiva/sclera: Conjunctivae normal.     Pupils: Pupils are equal, round, and reactive to light.  Neck:     Musculoskeletal: Normal range of motion.  Cardiovascular:     Rate and Rhythm: Normal rate and regular rhythm.     Heart sounds: Normal heart sounds.  Pulmonary:     Effort: Pulmonary effort is normal. No respiratory distress.     Breath sounds: Normal breath sounds.  Abdominal:   General: Bowel sounds are normal. There is no distension.     Palpations: Abdomen is soft.  Musculoskeletal: Normal range of motion.  Skin:    General: Skin is warm and dry.  Neurological:     Mental Status: He is alert and oriented to person, place, and time.  Psychiatric:        Behavior: Behavior normal.        Thought Content: Thought content normal.     ASSESSMENT/PLAN:   1. History of viral gastroenteritis Patient with resolution of symptoms. He is to continue with clear liquids and bland diet and increase to regular diet slowly. Plenty of fluids in order to avoid dehydration.         The patient was given clear instructions to go to ER or return to medical center if symptoms do not improve, worsen or new problems develop. The patient verbalized understanding and agreed with plan of care.   Ms. Riley Carpenter. Riley Lam, FNP-BC [facility] [address] [place], [place] [address] [phone]     This note has been created with Dragon speech recognition software and smart phrase technology. Any transcriptional errors are unintentional.

## 2018-09-18 NOTE — Patient Instructions (Signed)
Viral Gastroenteritis, Adult    Viral gastroenteritis is also known as the stomach flu. This condition is caused by certain germs (viruses). These germs can be passed from person to person very easily (are very contagious). This condition can cause sudden watery poop (diarrhea), fever, and throwing up (vomiting).  Having watery poop and throwing up can make you feel weak and cause you to get dehydrated. Dehydration can make you tired and thirsty, make you have a dry mouth, and make it so you pee (urinate) less often. Older adults and people with other diseases or a weak defense system (immune system) are at higher risk for dehydration. It is important to replace the fluids that you lose from having watery poop and throwing up.  Follow these instructions at home:  Follow instructions from your doctor about how to care for yourself at home.  Eating and drinking  Follow these instructions as told by your doctor:   Take an oral rehydration solution (ORS). This is a drink that is sold at pharmacies and stores.   Drink clear fluids in small amounts as you are able, such as:  ? Water.  ? Ice chips.  ? Diluted fruit juice.  ? Low-calorie sports drinks.   Eat bland, easy-to-digest foods in small amounts as you are able, such as:  ? Bananas.  ? Applesauce.  ? Rice.  ? Low-fat (lean) meats.  ? Toast.  ? Crackers.   Avoid fluids that have a lot of sugar or caffeine in them.   Avoid alcohol.   Avoid spicy or fatty foods.  General instructions     Drink enough fluid to keep your pee (urine) clear or pale yellow.   Wash your hands often. If you cannot use soap and water, use hand sanitizer.   Make sure that all people in your home wash their hands well and often.   Rest at home while you get better.   Take over-the-counter and prescription medicines only as told by your doctor.   Watch your condition for any changes.   Take a warm bath to help with any burning or pain from having watery poop.   Keep all follow-up  visits as told by your doctor. This is important.  Contact a doctor if:   You cannot keep fluids down.   Your symptoms get worse.   You have new symptoms.   You feel light-headed or dizzy.   You have muscle cramps.  Get help right away if:   You have chest pain.   You feel very weak or you pass out (faint).   You see blood in your throw-up.   Your throw-up looks like coffee grounds.   You have bloody or black poop (stools) or poop that look like tar.   You have a very bad headache, a stiff neck, or both.   You have a rash.   You have very bad pain, cramping, or bloating in your belly (abdomen).   You have trouble breathing.   You are breathing very quickly.   Your heart is beating very quickly.   Your skin feels cold and clammy.   You feel confused.   You have pain when you pee.   You have signs of dehydration, such as:  ? Dark pee, hardly any pee, or no pee.  ? Cracked lips.  ? Dry mouth.  ? Sunken eyes.  ? Sleepiness.  ? Weakness.  This information is not intended to replace advice given to you by your   health care provider. Make sure you discuss any questions you have with your health care provider.  Document Released: 01/03/2008 Document Revised: 04/10/2018 Document Reviewed: 03/23/2015  [employer]vier Interactive Patient Education  2019 [employer]

## 2018-12-27 ENCOUNTER — Ambulatory Visit (INDEPENDENT_AMBULATORY_CARE_PROVIDER_SITE_OTHER): Payer: Self-pay | Admitting: Family Medicine

## 2018-12-27 ENCOUNTER — Other Ambulatory Visit: Payer: Self-pay

## 2018-12-27 ENCOUNTER — Encounter: Payer: Self-pay | Admitting: Family Medicine

## 2018-12-27 VITALS — BP 140/72 | HR 84 | Temp 98.0°F | Ht 75.0 in | Wt 307.2 lb

## 2018-12-27 DIAGNOSIS — M25561 Pain in right knee: Secondary | ICD-10-CM

## 2018-12-27 MED ORDER — IBUPROFEN 600 MG PO TABS: 600.0000 mg | ORAL_TABLET | Freq: Three times a day (TID) | ORAL | 0 refills | Status: AC | PRN

## 2018-12-27 NOTE — Progress Notes (Signed)
  [facility] Riley Carpenter is a 23 y.o. male   HPI:  Knee Pain: Patient presents with knee pain involving the  right knee. Onset of the symptoms was about a month ago. Inciting event: this is a longstanding problem which has been getting worse, Patient reports he injured the knee 5 years ago. . Current symptoms include giving out, locking and popping sensation. Pain is aggravated by any weight bearing.  Patient has had prior knee problems. Evaluation to date: plain films: abnormal 2016- joint effusion. Treatment to date: OTC analgesics which are somewhat effective He does not take them regularly.   History reviewed. No pertinent past medical history. History reviewed. No pertinent surgical history.  Current Outpatient Medications:  .  ibuprofen (ADVIL) 600 MG tablet, Take 1 tablet (600 mg total) by mouth every 8 (eight) hours as needed., Disp: 30 tablet, Rfl: 0 No Known Allergies  reports that he has been smoking. He has been smoking about 1.00 pack per day. His smokeless tobacco use includes chew. He reports current alcohol use. He reports previous drug use. Family History  Problem Relation Age of Onset  . Diabetes Mother   . Diabetes Father     Knee: Physical Exam Vitals signs and nursing note reviewed.  Constitutional:      General: He is in acute distress.     Appearance: Normal appearance.  HENT:     Head: Normocephalic and atraumatic.  Musculoskeletal: Normal range of motion.        General: Tenderness present. No signs of injury (in 2016).     Right knee: Tenderness (medial joint line) found.  Neurological:     Mental Status: He is alert.   1. Right knee pain, unspecified chronicity He would like to wait on the xray due to cost. He is agreeable to trying ibuprofen on a regular basis. He also has a knee brace to provide support. RICE therapy recommended.  - DG Knee Complete 4 Views Right; Future - ibuprofen (ADVIL) 600 MG  tablet; Take 1 tablet (600 mg total) by mouth every 8 (eight) hours as needed.  Dispense: 30 tablet; Refill: 0

## 2018-12-27 NOTE — Patient Instructions (Signed)

## 2019-04-08 ENCOUNTER — Other Ambulatory Visit: Payer: Self-pay

## 2019-04-08 DIAGNOSIS — Z20822 Contact with and (suspected) exposure to covid-19: Secondary | ICD-10-CM

## 2019-04-09 ENCOUNTER — Encounter (HOSPITAL_COMMUNITY): Payer: Self-pay

## 2019-04-09 ENCOUNTER — Encounter (HOSPITAL_COMMUNITY): Payer: Self-pay | Admitting: *Deleted

## 2019-04-09 LAB — NOVEL CORONAVIRUS, NAA: SARS-CoV-2, NAA: NOT DETECTED
